# Patient Record
Sex: Male | Born: 2000 | Race: White | Hispanic: Yes | Marital: Single | Smoking: Never smoker
Health system: Southern US, Community
[De-identification: ages and names within clinical notes are randomized; demographics above are authoritative.]

## PROBLEM LIST (undated history)

## (undated) DIAGNOSIS — K219 Gastro-esophageal reflux disease without esophagitis: Secondary | ICD-10-CM

## (undated) DIAGNOSIS — J309 Allergic rhinitis, unspecified: Secondary | ICD-10-CM

## (undated) DIAGNOSIS — Z8709 Personal history of other diseases of the respiratory system: Secondary | ICD-10-CM

## (undated) DIAGNOSIS — Z91013 Allergy to seafood: Secondary | ICD-10-CM

## (undated) HISTORY — PX: NASAL SEPTUM SURGERY: SHX37

## (undated) HISTORY — DX: Gastro-esophageal reflux disease without esophagitis: K21.9

## (undated) HISTORY — DX: Personal history of other diseases of the respiratory system: Z87.09

## (undated) HISTORY — DX: Allergic rhinitis, unspecified: J30.9

## (undated) HISTORY — DX: Allergy to seafood: Z91.013

---
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
## 2020-03-03 DIAGNOSIS — J320 Chronic maxillary sinusitis: Secondary | ICD-10-CM | POA: Diagnosis not present

## 2020-03-03 DIAGNOSIS — J322 Chronic ethmoidal sinusitis: Secondary | ICD-10-CM | POA: Diagnosis not present

## 2020-03-03 DIAGNOSIS — J338 Other polyp of sinus: Secondary | ICD-10-CM | POA: Diagnosis not present

## 2020-03-03 DIAGNOSIS — J310 Chronic rhinitis: Secondary | ICD-10-CM | POA: Diagnosis not present

## 2020-04-14 IMAGING — CT CT MAXILLOFACIAL W/O CM
3 of 5 series · 13 of 47 positions shown, 15 images · non-contrast
Comparison: None.

CLINICAL DATA: Unable to breathe out of LEFT nostril.

EXAM:
CT MAXILLOFACIAL WITHOUT CONTRAST
TECHNIQUE: Multidetector CT images of the paranasal sinuses were obtained using
the standard protocol without intravenous contrast. Study was
performed according to the stealth protocol.

[Series 2: standard · axial · 0.39mm/px · z∈[+1516,+1614]mm · 8 of 114 slices shown, 10 images]
[im 8/114  brain]
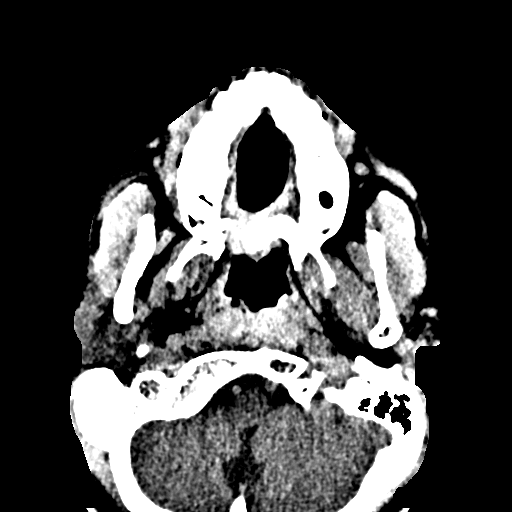
[im 8/114  bone]
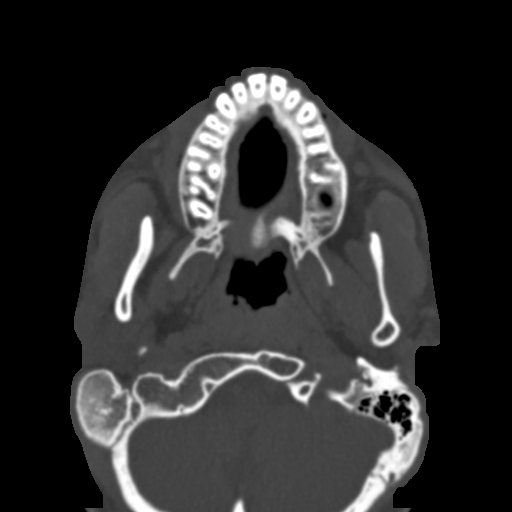
[im 24/114  bone]
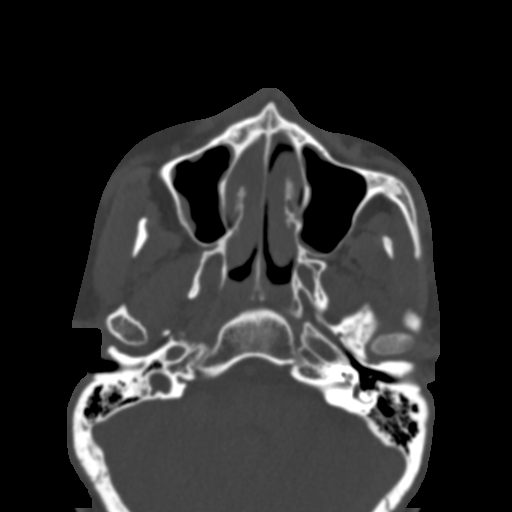
[im 36/114  bone]
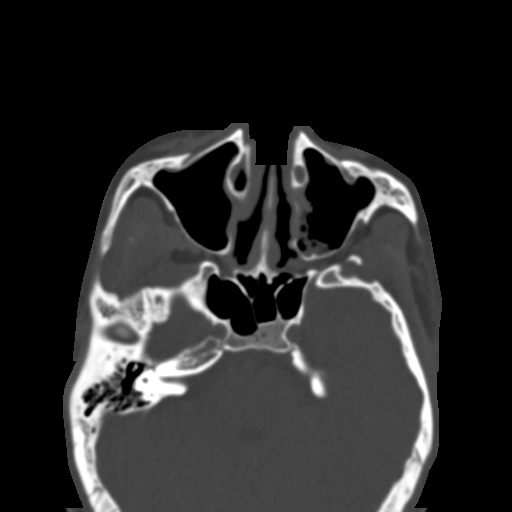
[im 51/114  bone]
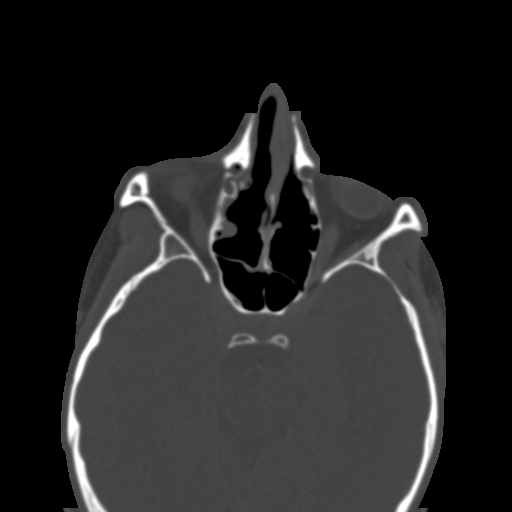
[im 63/114  brain]
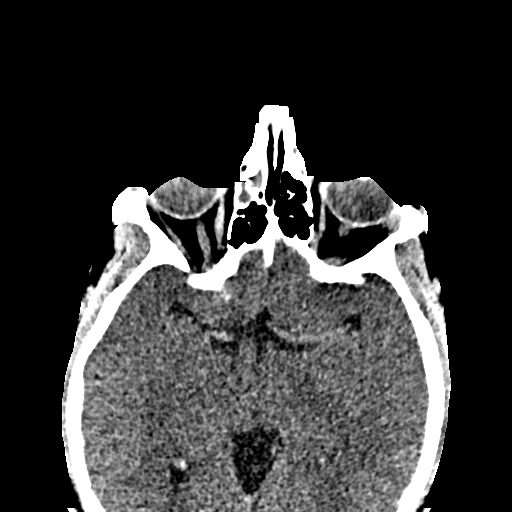
[im 63/114  bone]
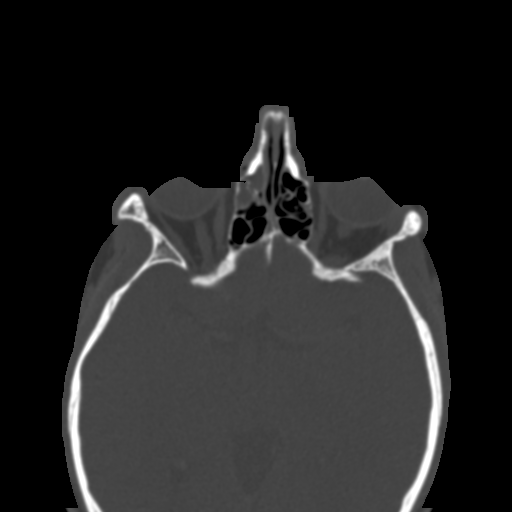
[im 78/114  bone]
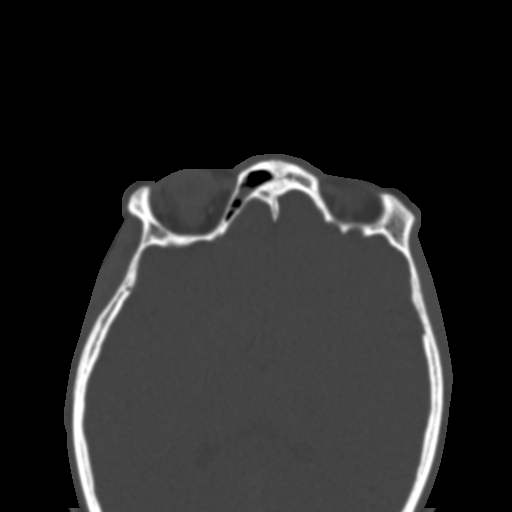
[im 90/114  bone]
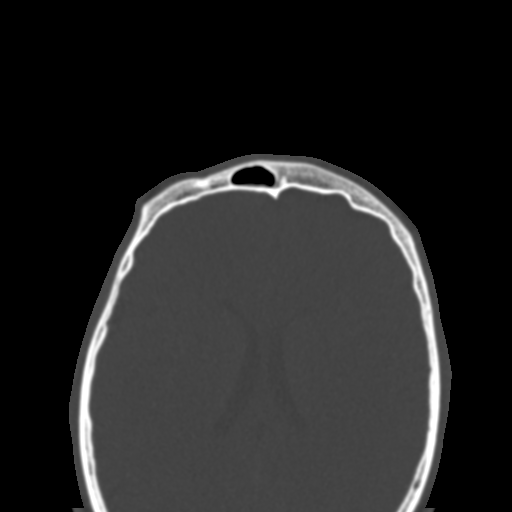
[im 106/114  bone]
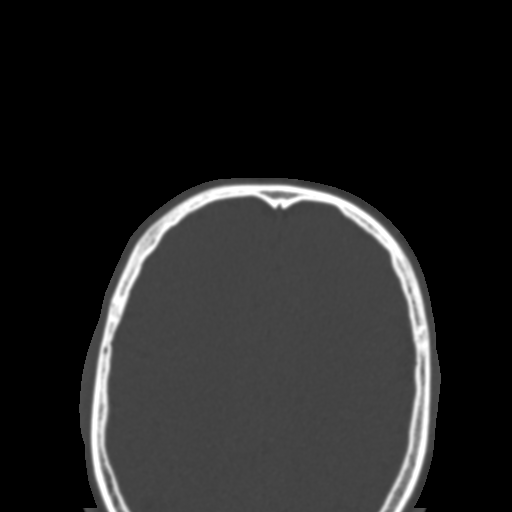

[Series 5: sagittal · sagittal · 0.22mm/px · 2 of 101 slices shown]
[im 34/101  bone]
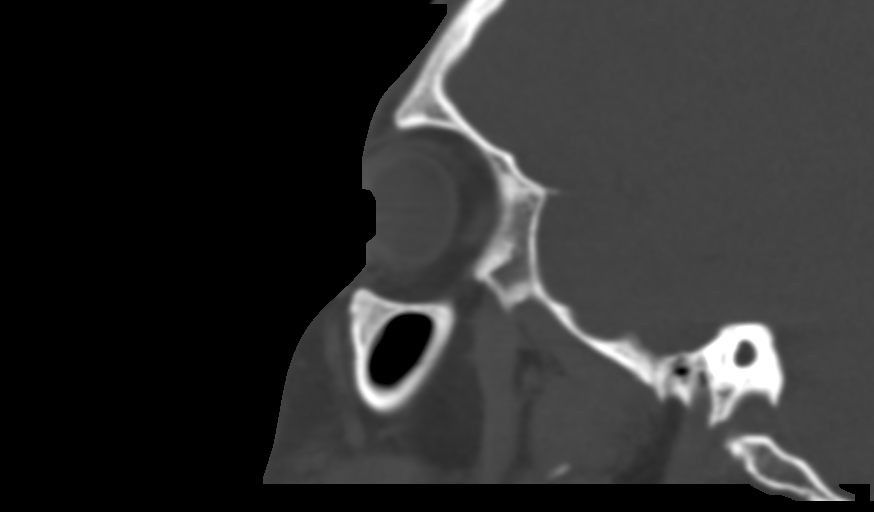
[im 67/101  bone]
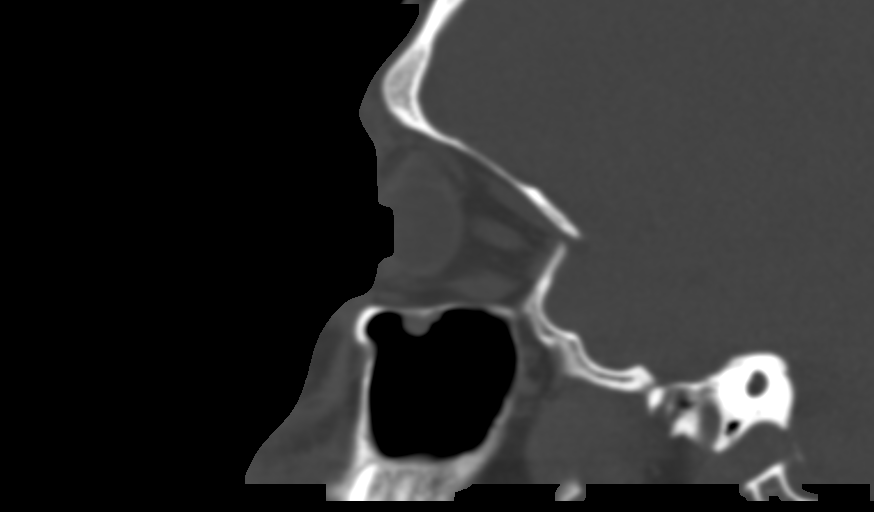

[Series 8: coronal bone · coronal · 0.22mm/px · 3 of 93 slices shown]
[im 24/93  bone]
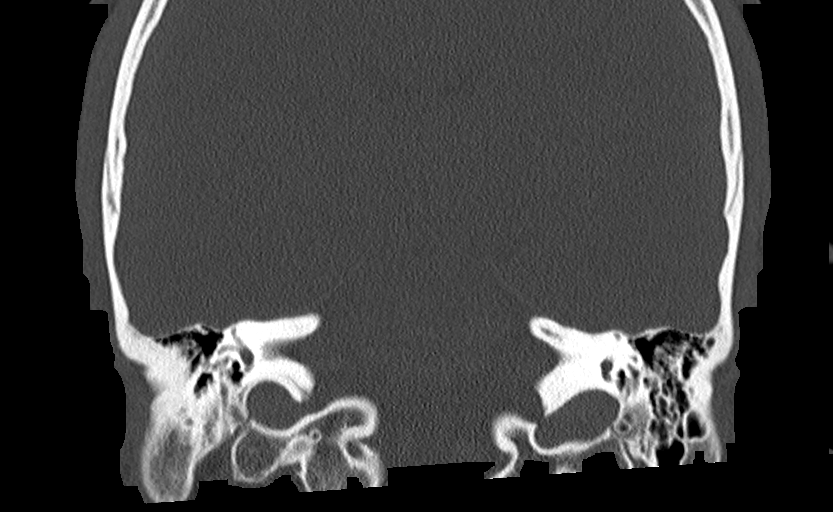
[im 47/93  bone]
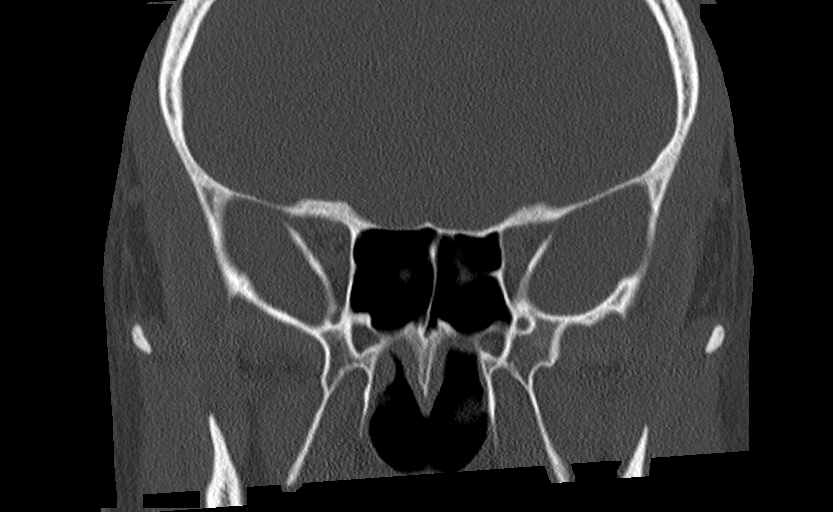
[im 70/93  bone]
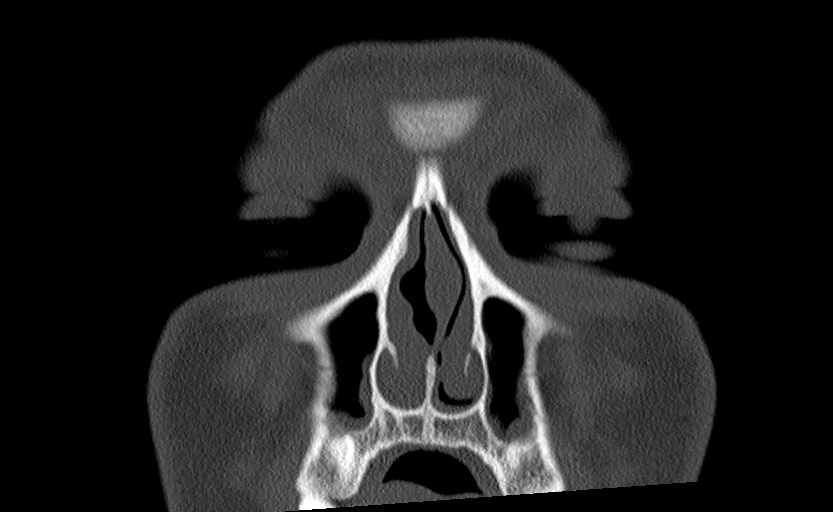

[13 of 47 positions shown; findings below may reference images not displayed]

FINDINGS: Paranasal sinuses:

Frontal: LEFT frontal sinuses hypoplastic. RIGHT frontal sinus
demonstrates mucosal thickening and obstruction of the frontal sinus
drainage pathway.

Ethmoid: BILATERAL ethmoidectomies. Residual mucosal thickening is
greater in the RIGHT greater than LEFT ethmoid regions.

Maxillary: BILATERAL maxillary antrectomies. Residual mucosal
thickening in both maxillary sinuses. Slight layering fluid on the
LEFT.

Sphenoid: Normally aerated. Patent sphenoethmoidal recesses.

Right ostiomeatal unit: Surgically absent. No residual barriers to
maxillary and ethmoid drainage.

Left ostiomeatal unit: Surgically absent. No residual barriers to
maxillary and ethmoid drainage.

Nasal passages: Patent. Intact nasal septum deviates mildly
RIGHT-to-LEFT.

Anatomy: Slight pneumatization superior to anterior ethmoid notches.
Symmetric and intact olfactory grooves and fovea ethmoidalis, Keros
I (1-3mm). Sellar sphenoid pneumatization pattern.

Other: Orbits and intracranial compartment are unremarkable. Visible
mastoid air cells are normally aerated.
IMPRESSION: 1. Status post BILATERAL maxillary antrectomies and BILATERAL
ethmoidectomies. No residual barriers to maxillary and ethmoid
drainage.
2. Residual RIGHT anterior ethmoid mucosal thickening and slight
layering fluid LEFT maxillary sinus.
3. RIGHT frontal sinus mucosal thickening and suspected obstruction
due to mucosal thickening involving the frontal sinus drainage
pathway.

## 2020-04-15 DIAGNOSIS — Z23 Encounter for immunization: Secondary | ICD-10-CM | POA: Diagnosis not present

## 2020-04-29 DIAGNOSIS — Z23 Encounter for immunization: Secondary | ICD-10-CM | POA: Diagnosis not present

## 2020-05-15 ENCOUNTER — Other Ambulatory Visit: Payer: Self-pay

## 2020-05-15 ENCOUNTER — Ambulatory Visit (INDEPENDENT_AMBULATORY_CARE_PROVIDER_SITE_OTHER): Payer: No Typology Code available for payment source | Admitting: Pediatrics

## 2020-05-15 VITALS — Temp 98.6°F | Wt 188.5 lb

## 2020-05-15 DIAGNOSIS — K219 Gastro-esophageal reflux disease without esophagitis: Secondary | ICD-10-CM

## 2020-05-15 MED ORDER — OMEPRAZOLE 40 MG PO CPDR: 40.0000 mg | DELAYED_RELEASE_CAPSULE | Freq: Every day | ORAL | 3 refills | Status: DC

## 2020-05-15 NOTE — Progress Notes (Signed)
Azavier is an 19 year old male here for stomach discomfort for about 1 year.  He was seen in the [facility] ED about a year ago and was told to take OTC antiacids.  He has been taking Prilosec 20 mg, inconsistently over the last year.  It was helpful at first now it is less helpful.  He wakes up about 0400-0500 with a gnawing sensation in his stomach that his very uncomfortable rates his discomfort at 7/10.  Patient reports the epigastric area is the site of his discomfort.    On exam -  Head - normal cephalic Eyes - clear, no erythremia, edema or drainage Ears - TM clear bilaterally  Nose - clear rhinorrhea  Throat - mild erythemia Neck - no adenopathy  Lungs - CTA Heart - RRR with out murmur Abdomen - soft with good bowel sounds GU - not examined MS - Active ROM Neuro - no deficits   This is a 19 year old male with GERD.  Start Prilosec 40 mg daily Referral to GI. Please call or return to this clinic if symptoms worsen or fail to improve after referral.

## 2020-05-20 ENCOUNTER — Encounter: Payer: Self-pay | Admitting: Internal Medicine

## 2020-07-02 ENCOUNTER — Ambulatory Visit: Payer: No Typology Code available for payment source | Admitting: Gastroenterology

## 2020-08-28 ENCOUNTER — Other Ambulatory Visit: Payer: Self-pay

## 2020-08-28 ENCOUNTER — Ambulatory Visit (INDEPENDENT_AMBULATORY_CARE_PROVIDER_SITE_OTHER): Payer: Medicaid Other | Admitting: Nurse Practitioner

## 2020-08-28 ENCOUNTER — Encounter: Payer: Self-pay | Admitting: Nurse Practitioner

## 2020-08-28 DIAGNOSIS — R109 Unspecified abdominal pain: Secondary | ICD-10-CM | POA: Insufficient documentation

## 2020-08-28 DIAGNOSIS — R1013 Epigastric pain: Secondary | ICD-10-CM

## 2020-08-28 DIAGNOSIS — K219 Gastro-esophageal reflux disease without esophagitis: Secondary | ICD-10-CM | POA: Insufficient documentation

## 2020-08-28 NOTE — Patient Instructions (Addendum)
Your health issues we discussed today were:   GERD (reflux/heartburn) with abdominal pain: 1. I am glad you are feeling better! 2. Continue taking omeprazole (Prilosec) 40 mg once a day 3. Let us know if you have any worsening or recurrent symptoms 4. I am printing some information about GERD and things you can do to help prevent worsening GERD symptoms 5. Eventually we can talk about trying to come off the medication and see if you do well without it, but we will keep you on it for now  Overall I recommend:  1. It was great talking with you today! 2. Return for follow-up in 3 months 3. Continue your other current medications 4. Call us if you have any questions or concerns   ---------------------------------------------------------------  I am glad you have gotten your COVID-19 vaccination!  Even though you are fully vaccinated you should continue to follow CDC and state/local guidelines.  ---------------------------------------------------------------   At [facility] we value your feedback. You may receive a survey about your visit today. Please share your experience as we strive to create trusting relationships with our patients to provide genuine, compassionate, quality care.  We appreciate your understanding and patience as we review any laboratory studies, imaging, and other diagnostic tests that are ordered as we care for you. Our office policy is 5 business days for review of these results, and any emergent or urgent results are addressed in a timely manner for your best interest. If you do not hear from our office in 1 week, please contact us.   We also encourage the use of MyChart, which contains your medical information for your review as well. If you are not enrolled in this feature, an access code is on this after visit summary for your convenience. Thank you for allowing us to be involved in your care.  It was great to see you today!  I hope you have a  great rest of your summer!!      Gastroesophageal Reflux Disease, Adult Gastroesophageal reflux (GER) happens when acid from the stomach flows up into the tube that connects the mouth and the stomach (esophagus). Normally, food travels down the esophagus and stays in the stomach to be digested. However, when a person has GER, food and stomach acid sometimes move back up into the esophagus. If this becomes a more serious problem, the person may be diagnosed with a disease called gastroesophageal reflux disease (GERD). GERD occurs when the reflux:  Happens often.  Causes frequent or severe symptoms.  Causes problems such as damage to the esophagus. When stomach acid comes in contact with the esophagus, the acid may cause soreness (inflammation) in the esophagus. Over time, GERD may create small holes (ulcers) in the lining of the esophagus. What are the causes? This condition is caused by a problem with the muscle between the esophagus and the stomach (lower esophageal sphincter, or LES). Normally, the LES muscle closes after food passes through the esophagus to the stomach. When the LES is weakened or abnormal, it does not close properly, and that allows food and stomach acid to go back up into the esophagus. The LES can be weakened by certain dietary substances, medicines, and medical conditions, including:  Tobacco use.  Pregnancy.  Having a hiatal hernia.  Alcohol use.  Certain foods and beverages, such as coffee, chocolate, onions, and peppermint. What increases the risk? You are more likely to develop this condition if you:  Have an increased body weight.  Have a connective  tissue disorder.  Use NSAID medicines. What are the signs or symptoms? Symptoms of this condition include:  Heartburn.  Difficult or painful swallowing.  The feeling of having a lump in the throat.  Abitter taste in the mouth.  Bad breath.  Having a large amount of saliva.  Having an upset  or bloated stomach.  Belching.  Chest pain. Different conditions can cause chest pain. Make sure you see your health care provider if you experience chest pain.  Shortness of breath or wheezing.  Ongoing (chronic) cough or a night-time cough.  Wearing away of tooth enamel.  Weight loss. How is this diagnosed? Your health care provider will take a medical history and perform a physical exam. To determine if you have mild or severe GERD, your health care provider may also monitor how you respond to treatment. You may also have tests, including:  A test to examine your stomach and esophagus with a small camera (endoscopy).  A test thatmeasures the acidity level in your esophagus.  A test thatmeasures how much pressure is on your esophagus.  A barium swallow or modified barium swallow test to show the shape, size, and functioning of your esophagus. How is this treated? The goal of treatment is to help relieve your symptoms and to prevent complications. Treatment for this condition may vary depending on how severe your symptoms are. Your health care provider may recommend:  Changes to your diet.  Medicine.  Surgery. Follow these instructions at home: Eating and drinking   Follow a diet as recommended by your health care provider. This may involve avoiding foods and drinks such as: ? Coffee and tea (with or without caffeine). ? Drinks that containalcohol. ? Energy drinks and sports drinks. ? Carbonated drinks or sodas. ? Chocolate and cocoa. ? Peppermint and mint flavorings. ? Garlic and onions. ? Horseradish. ? Spicy and acidic foods, including peppers, chili powder, curry powder, vinegar, hot sauces, and barbecue sauce. ? Citrus fruit juices and citrus fruits, such as oranges, lemons, and limes. ? Tomato-based foods, such as red sauce, chili, salsa, and pizza with red sauce. ? Fried and fatty foods, such as donuts, french fries, potato chips, and high-fat  dressings. ? High-fat meats, such as hot dogs and fatty cuts of red and white meats, such as rib eye steak, sausage, ham, and bacon. ? High-fat dairy items, such as whole milk, butter, and cream cheese.  Eat small, frequent meals instead of large meals.  Avoid drinking large amounts of liquid with your meals.  Avoid eating meals during the 2-3 hours before bedtime.  Avoid lying down right after you eat.  Do not exercise right after you eat. Lifestyle   Do not use any products that contain nicotine or tobacco, such as cigarettes, e-cigarettes, and chewing tobacco. If you need help quitting, ask your health care provider.  Try to reduce your stress by using methods such as yoga or meditation. If you need help reducing stress, ask your health care provider.  If you are overweight, reduce your weight to an amount that is healthy for you. Ask your health care provider for guidance about a safe weight loss goal. General instructions  Pay attention to any changes in your symptoms.  Take over-the-counter and prescription medicines only as told by your health care provider. Do not take aspirin, ibuprofen, or other NSAIDs unless your health care provider told you to do so.  Wear loose-fitting clothing. Do not wear anything tight around your waist that causes pressure  on your abdomen.  Raise (elevate) the head of your bed about 6 inches (15 cm).  Avoid bending over if this makes your symptoms worse.  Keep all follow-up visits as told by your health care provider. This is important. Contact a health care provider if:  You have: ? New symptoms. ? Unexplained weight loss. ? Difficulty swallowing or it hurts to swallow. ? Wheezing or a persistent cough. ? A hoarse voice.  Your symptoms do not improve with treatment. Get help right away if you:  Have pain in your arms, neck, jaw, teeth, or back.  Feel sweaty, dizzy, or light-headed.  Have chest pain or shortness of breath.  Vomit  and your vomit looks like blood or coffee grounds.  Faint.  Have stool that is bloody or black.  Cannot swallow, drink, or eat. Summary  Gastroesophageal reflux happens when acid from the stomach flows up into the esophagus. GERD is a disease in which the reflux happens often, causes frequent or severe symptoms, or causes problems such as damage to the esophagus.  Treatment for this condition may vary depending on how severe your symptoms are. Your health care provider may recommend diet and lifestyle changes, medicine, or surgery.  Contact a health care provider if you have new or worsening symptoms.  Take over-the-counter and prescription medicines only as told by your health care provider. Do not take aspirin, ibuprofen, or other NSAIDs unless your health care provider told you to do so.  Keep all follow-up visits as told by your health care provider. This is important. This information is not intended to replace advice given to you by your health care provider. Make sure you discuss any questions you have with your health care provider. Document Revised: 06/14/2018 Document Reviewed: 06/14/2018 Elsevier Patient Education  2020 [employer].    Food Choices for Gastroesophageal Reflux Disease, Adult When you have gastroesophageal reflux disease (GERD), the foods you eat and your eating habits are very important. Choosing the right foods can help ease the discomfort of GERD. Consider working with a diet and nutrition specialist (dietitian) to help you make healthy food choices. What general guidelines should I follow?  Eating plan  Choose healthy foods low in fat, such as fruits, vegetables, whole grains, low-fat dairy products, and lean meat, fish, and poultry.  Eat frequent, small meals instead of three large meals each day. Eat your meals slowly, in a relaxed setting. Avoid bending over or lying down until 2-3 hours after eating.  Limit high-fat foods such as fatty meats or  fried foods.  Limit your intake of oils, butter, and shortening to less than 8 teaspoons each day.  Avoid the following: ? Foods that cause symptoms. These may be different for different people. Keep a food diary to keep track of foods that cause symptoms. ? Alcohol. ? Drinking large amounts of liquid with meals. ? Eating meals during the 2-3 hours before bed.  Cook foods using methods other than frying. This may include baking, grilling, or broiling. Lifestyle  Maintain a healthy weight. Ask your health care provider what weight is healthy for you. If you need to lose weight, work with your health care provider to do so safely.  Exercise for at least 30 minutes on 5 or more days each week, or as told by your health care provider.  Avoid wearing clothes that fit tightly around your waist and chest.  Do not use any products that contain nicotine or tobacco, such as cigarettes and e-cigarettes.  If you need help quitting, ask your health care provider.  Sleep with the head of your bed raised. Use a wedge under the mattress or blocks under the bed frame to raise the head of the bed. What foods are not recommended? The items listed may not be a complete list. Talk with your dietitian about what dietary choices are best for you. Grains Pastries or quick breads with added fat. [place] toast. Vegetables Deep fried vegetables. [place] fries. Any vegetables prepared with added fat. Any vegetables that cause symptoms. For some people this may include tomatoes and tomato products, chili peppers, onions and garlic, and horseradish. Fruits Any fruits prepared with added fat. Any fruits that cause symptoms. For some people this may include citrus fruits, such as oranges, grapefruit, pineapple, and lemons. Meats and other protein foods High-fat meats, such as fatty beef or pork, hot dogs, ribs, ham, sausage, salami and bacon. Fried meat or protein, including fried fish and fried chicken. Nuts and nut  butters. Dairy Whole milk and chocolate milk. Sour cream. Cream. Ice cream. Cream cheese. Milk shakes. Beverages Coffee and tea, with or without caffeine. Carbonated beverages. Sodas. Energy drinks. Fruit juice made with acidic fruits (such as orange or grapefruit). Tomato juice. Alcoholic drinks. Fats and oils Butter. Margarine. Shortening. Ghee. Sweets and desserts Chocolate and cocoa. Donuts. Seasoning and other foods Pepper. Peppermint and spearmint. Any condiments, herbs, or seasonings that cause symptoms. For some people, this may include curry, hot sauce, or vinegar-based salad dressings. Summary  When you have gastroesophageal reflux disease (GERD), food and lifestyle choices are very important to help ease the discomfort of GERD.  Eat frequent, small meals instead of three large meals each day. Eat your meals slowly, in a relaxed setting. Avoid bending over or lying down until 2-3 hours after eating.  Limit high-fat foods such as fatty meat or fried foods. This information is not intended to replace advice given to you by your health care provider. Make sure you discuss any questions you have with your health care provider. Document Revised: 03/29/2019 Document Reviewed: 12/07/2016 Elsevier Patient Education  2020 [employer].

## 2020-08-28 NOTE — Progress Notes (Signed)
Primary Care Physician:  Richrd Sox, MD Primary Gastroenterologist:  Dr. Marletta Lor  Chief Complaint  Patient presents with  . Gastroesophageal Reflux    x 1 year    HPI:   David Mcintosh is a 19 y.o. male who presents on referral from primary care for GERD.  Reviewed information provided with referral including office visit dated 05/15/2020 for GERD.  At that time noted abdominal discomfort for the previous 1 year, recently seen in the ED ED about a year ago and was told to take over-the-counter antacids.  Has been taking Prilosec 20 mg inconsistently over the last year that was initially helpful but now less so.  Wakes up early morning with a "gnawing sensation" in his stomach but is very uncomfortable and epigastric pain as well.  Recommended starting Prilosec 40 mg daily, referral to GI.  Today he states he doing okay overall. He's been taking Prilosec 40 mg daily consistently. At one time he started to have his symptoms and took a dose of Prilosec with near immediate relief. The pain is epigastric/RUQ, unknown triggers. When he went to the ER he had taken someone very acidic. Not really having GERD symptoms now that he's on omeprazole 40 mg daily. Denies N/V, hematochezia, melena, fever, chills, unintentional weight loss. Denies URI or flu-like symptoms. Denies loss of sense of taste or smell. The patient has received COVID-19 vaccination(s). Denies chest pain, dyspnea, dizziness, lightheadedness, syncope, near syncope. Denies any other upper or lower GI symptoms.  Past Medical History:  Diagnosis Date  . Allergic rhinitis   . GERD (gastroesophageal reflux disease)   . History of deviated nasal septum    Surgery 2018   . Seafood allergy     Past Surgical History:  Procedure Laterality Date  . ETHMOIDECTOMY Left 07/24/2019   Procedure: LEFT ENDOSCOPIC TOTAL ETHMOIDECTOMY/ BILATERAL TURBINATE REDUCTION;  Surgeon: Newman Pies, MD;  Location: [facility];  Service: ENT;   Laterality: Left;  . FRONTAL SINUS EXPLORATION Right 07/24/2019   Procedure: RIGHT FRONTAL RECESS   EXPLORATION;  Surgeon: Newman Pies, MD;  Location: [facility];  Service: ENT;  Laterality: Right;  . MAXILLARY ANTROSTOMY Bilateral 07/24/2019   Procedure: BILATERAL ENDOSCOPIC MAXILLARY ANTROSTOMY WITH TISSUE REMOVAL;  Surgeon: Newman Pies, MD;  Location: [facility];  Service: ENT;  Laterality: Bilateral;  . NASAL SEPTUM SURGERY    . SINUS ENDO WITH FUSION N/A 07/24/2019   Procedure: SINUS ENDO WITH FUSION;  Surgeon: Newman Pies, MD;  Location: [facility];  Service: ENT;  Laterality: N/A;    Current Outpatient Medications  Medication Sig Dispense Refill  . EPINEPHrine 0.3 mg/0.3 mL IJ SOAJ injection Dispense generic for insurance. One injection to outer thigh for anaphylaxis per package instructions and call 911 1 each 2  . fluticasone (FLONASE) 50 MCG/ACT nasal spray Place 1 spray into both nostrils daily as needed for allergies or rhinitis. 1 g 5  . omeprazole (PRILOSEC) 40 MG capsule Take 1 capsule (40 mg total) by mouth daily. 30 capsule 3   No current facility-administered medications for this visit.    Allergies as of 08/28/2020 - Review Complete 08/28/2020  Allergen Reaction Noted  . Shrimp [shellfish allergy] Swelling 07/24/2019    Family History  Problem Relation Age of Onset  . Healthy Mother   . Colon cancer Neg Hx   . Gastric cancer Neg Hx   . Esophageal cancer Neg Hx     Social History  Socioeconomic History  . Marital status: Single    Spouse name: Not on file  . Number of children: Not on file  . Years of education: Not on file  . Highest education level: Not on file  Occupational History  . Not on file  Tobacco Use  . Smoking status: Never Smoker  . Smokeless tobacco: Never Used  Substance and Sexual Activity  . Alcohol use: Never  . Drug use: Never  . Sexual activity: Not on file  Other Topics Concern  . Not on file    Social History Narrative   Moved from [place] in June 2018       Lives with parents, siblings       Wants to be an Art gallery manager    Social Determinants of Corporate investment banker Strain:   . Difficulty of Paying Living Expenses: Not on file  Food Insecurity:   . Worried About Programme researcher, broadcasting/film/video in the Last Year: Not on file  . Ran Out of Food in the Last Year: Not on file  Transportation Needs:   . Lack of Transportation (Medical): Not on file  . Lack of Transportation (Non-Medical): Not on file  Physical Activity:   . Days of Exercise per Week: Not on file  . Minutes of Exercise per Session: Not on file  Stress:   . Feeling of Stress : Not on file  Social Connections:   . Frequency of Communication with Friends and Family: Not on file  . Frequency of Social Gatherings with Friends and Family: Not on file  . Attends Religious Services: Not on file  . Active Member of Clubs or Organizations: Not on file  . Attends Banker Meetings: Not on file  . Marital Status: Not on file  Intimate Partner Violence:   . Fear of Current or Ex-Partner: Not on file  . Emotionally Abused: Not on file  . Physically Abused: Not on file  . Sexually Abused: Not on file    Subjective: Review of Systems  Constitutional: Negative for chills, fever, malaise/fatigue and weight loss.  HENT: Negative for congestion and sore throat.   Respiratory: Negative for cough and shortness of breath.   Cardiovascular: Negative for chest pain and palpitations.  Gastrointestinal: Negative for abdominal pain, blood in stool, diarrhea, melena, nausea and vomiting.  Musculoskeletal: Negative for joint pain and myalgias.  Skin: Negative for rash.  Neurological: Negative for dizziness and weakness.  Endo/Heme/Allergies: Does not bruise/bleed easily.  Psychiatric/Behavioral: Negative for depression. The patient is not nervous/anxious.   All other systems reviewed and are negative.      Objective: BP  139/74   Pulse 66   Temp 97.9 F (36.6 C) (Temporal)   Ht 5\' 11"  (1.803 m)   Wt 204 lb (92.5 kg)   BMI 28.45 kg/m  Physical Exam Vitals and nursing note reviewed.  Constitutional:      General: He is not in acute distress.    Appearance: Normal appearance. He is normal weight. He is not ill-appearing, toxic-appearing or diaphoretic.  HENT:     Head: Normocephalic and atraumatic.     Nose: No congestion or rhinorrhea.  Eyes:     General: No scleral icterus. Cardiovascular:     Rate and Rhythm: Normal rate and regular rhythm.     Heart sounds: Normal heart sounds.  Pulmonary:     Effort: Pulmonary effort is normal.     Breath sounds: Normal breath sounds.  Abdominal:  General: Bowel sounds are normal. There is no distension.     Palpations: Abdomen is soft. There is no hepatomegaly, splenomegaly or mass.     Tenderness: There is no abdominal tenderness. There is no guarding or rebound.     Hernia: No hernia is present.  Musculoskeletal:     Cervical back: Neck supple.  Skin:    General: Skin is warm and dry.     Coloration: Skin is not jaundiced.     Findings: No bruising or rash.  Neurological:     General: No focal deficit present.     Mental Status: He is alert and oriented to person, place, and time. Mental status is at baseline.  Psychiatric:        Mood and Affect: Mood normal.        Behavior: Behavior normal.        Thought Content: Thought content normal.      Assessment:  Very pleasant 19 year old male who presented to his PCP with complaints of GERD-like symptoms and epigastric pain for the previous year.  He was seen about a year ago in the [facility] emergency room they recommend he take over-the-counter antacids.  He also attempted Prilosec 20 mg intermittently.  His primary care placed him on omeprazole 40 mg daily.  He states he has not missed a dose since and his symptoms have essentially resolved.  No breakthrough symptoms on his  omeprazole 40 mg.  We discussed lifestyle changes and trigger avoidance.  I have printed further information for him as well.  I do not feel there is need for endoscopic evaluation at this point, although this certainly could change depending on his clinical progress.  Hopefully within the next 3 to 6 months we can try coming off his PPI to see if he has continued resolution   Plan: 1. Continue omeprazole 40 mg daily for now 2. Continue other medications 3. Follow-up in 3 months 4. Notify us of worsening symptoms 5. Printed GERD information provided    Thank you for allowing [place] to participate in the care of [facility]  Wynne Dust, [place], AGNP-C Adult & Gerontological Nurse Practitioner [facility]   08/28/2020 4:24 PM   Disclaimer: This note was dictated with voice recognition software. Similar sounding words can inadvertently be transcribed and may not be corrected upon review.

## 2020-09-17 ENCOUNTER — Other Ambulatory Visit: Payer: Self-pay | Admitting: Pediatrics

## 2020-09-29 ENCOUNTER — Ambulatory Visit: Payer: Medicaid Other | Admitting: Pediatrics

## 2020-10-02 DIAGNOSIS — J310 Chronic rhinitis: Secondary | ICD-10-CM | POA: Diagnosis not present

## 2020-10-02 DIAGNOSIS — J343 Hypertrophy of nasal turbinates: Secondary | ICD-10-CM | POA: Diagnosis not present

## 2020-10-31 ENCOUNTER — Other Ambulatory Visit: Payer: Self-pay

## 2020-10-31 ENCOUNTER — Encounter: Payer: Self-pay | Admitting: Pediatrics

## 2020-10-31 ENCOUNTER — Ambulatory Visit (INDEPENDENT_AMBULATORY_CARE_PROVIDER_SITE_OTHER): Payer: Medicaid Other | Admitting: Pediatrics

## 2020-10-31 VITALS — BP 122/64 | Ht 70.5 in | Wt 199.0 lb

## 2020-10-31 DIAGNOSIS — Z113 Encounter for screening for infections with a predominantly sexual mode of transmission: Secondary | ICD-10-CM

## 2020-10-31 DIAGNOSIS — Z00121 Encounter for routine child health examination with abnormal findings: Secondary | ICD-10-CM

## 2020-10-31 DIAGNOSIS — L853 Xerosis cutis: Secondary | ICD-10-CM | POA: Diagnosis not present

## 2020-10-31 DIAGNOSIS — E663 Overweight: Secondary | ICD-10-CM | POA: Diagnosis not present

## 2020-10-31 DIAGNOSIS — Z0001 Encounter for general adult medical examination with abnormal findings: Secondary | ICD-10-CM

## 2020-10-31 MED ORDER — HYDROCORTISONE 2.5 % EX CREA: TOPICAL_CREAM | Freq: Two times a day (BID) | CUTANEOUS | 2 refills | Status: AC | PRN

## 2020-10-31 NOTE — Progress Notes (Signed)
  Adolescent Well Care Visit David Mcintosh is a 19 y.o. male who is here for well care.    PCP:  Richrd Sox, MD   History was provided by the patient.  Confidentiality was discussed with the patient and, if applicable, with caregiver as well. Patient's personal or confidential phone number: [phone]   Current Issues: Current concerns include  None .   Nutrition: Nutrition/Eating Behaviors: 3 meals with 1-2 servings  Adequate calcium in diet?: milk  Supplements/ Vitamins: no   Exercise/ Media: Play any Sports?/ Exercise: on occasion  Screen Time:  > 2 hours-counseling provided Media Rules or Monitoring?: no  Sleep:  Sleep: 8 hours   Social Screening: Lives with:  Family  Parental relations:  good Activities, Work, and Regulatory affairs officer?: helping around the house  Concerns regarding behavior with peers?  no Stressors of note: he is working   Confidential Social History: Tobacco?  no Secondhand smoke exposure?  no Drugs/ETOH?  no  Sexually Active?  no    Safe at home, in school & in relationships?  Yes Safe to self?  Yes   Screenings: Patient has a dental home: yes   PHQ-9 completed and results indicated 0  Physical Exam:  Vitals:   10/31/20 0926  BP: 122/64  Weight: 199 lb (90.3 kg)  Height: 5' 10.5" (1.791 m)   BP 122/64   Ht 5' 10.5" (1.791 m)   Wt 199 lb (90.3 kg)   BMI 28.15 kg/m  Body mass index: body mass index is 28.15 kg/m. Blood pressure percentiles are not available for patients who are 18 years or older.   Hearing Screening   125Hz  250Hz  500Hz  1000Hz  2000Hz  3000Hz  4000Hz  6000Hz  8000Hz   Right ear:   20 20 20 20 20     Left ear:   20 20 20 20 20       Visual Acuity Screening   Right eye Left eye Both eyes  Without correction:     With correction: 20/20 20/20 20/20     General Appearance:   alert, oriented, no acute distress and obese  HENT: Normocephalic, no obvious abnormality, conjunctiva clear  Mouth:   Normal appearing teeth, no obvious  discoloration, dental caries, or dental caps  Neck:   Supple; thyroid: no enlargement, symmetric, no tenderness/mass/nodules  Chest No masses   Lungs:   Clear to auscultation bilaterally, normal work of breathing  Heart:   Regular rate and rhythm, S1 and S2 normal, no murmurs;   Abdomen:   Soft, non-tender, no mass, or organomegaly  GU genitalia not examined  Musculoskeletal:   Tone and strength strong and symmetrical, all extremities               Lymphatic:   No cervical adenopathy  Skin/Hair/Nails:   Skin warm, dry and intact, no rashes, no bruises or petechiae  Neurologic:   Strength, gait, and coordination normal and age-appropriate     Assessment and Plan:   19 yo with male  1. Obesity: we discussed eating and lifestyle changes  2. Dry skin dermatitis: moisturize at least twice daily   BMI is not appropriate for age  Hearing screening result:normal Vision screening result: normal  Counseling provided for all of the components  Orders Placed This Encounter  Procedures  . C. trachomatis/N. gonorrhoeae RNA     Return in 1 year (on 10/31/2021).. We discussed his transitioning to an adult practice   , MD

## 2020-10-31 NOTE — Patient Instructions (Signed)

## 2020-11-03 LAB — C. TRACHOMATIS/N. GONORRHOEAE RNA
C. trachomatis RNA, TMA: NOT DETECTED
N. gonorrhoeae RNA, TMA: NOT DETECTED

## 2020-11-27 ENCOUNTER — Encounter: Payer: Self-pay | Admitting: Nurse Practitioner

## 2020-11-27 ENCOUNTER — Ambulatory Visit (INDEPENDENT_AMBULATORY_CARE_PROVIDER_SITE_OTHER): Payer: Medicaid Other | Admitting: Nurse Practitioner

## 2020-11-27 ENCOUNTER — Other Ambulatory Visit: Payer: Self-pay

## 2020-11-27 VITALS — BP 146/75 | HR 68 | Temp 97.3°F | Ht 71.0 in | Wt 197.0 lb

## 2020-11-27 DIAGNOSIS — K219 Gastro-esophageal reflux disease without esophagitis: Secondary | ICD-10-CM | POA: Diagnosis not present

## 2020-11-27 DIAGNOSIS — R1013 Epigastric pain: Secondary | ICD-10-CM | POA: Diagnosis not present

## 2020-11-27 NOTE — Patient Instructions (Signed)
Your health issues we discussed today were:   GERD (reflux/heartburn) with upper abdominal pain: 1. I am glad you are doing better! 2. For now, continue taking omeprazole 40 mg once a day 3. As we discussed, work on diet and exercise for weight loss with a goal weight of 180 pounds. 4. Avoid triggers that tend to cause a flare of your symptoms (such as pizza) 5. If you do have a flare of your symptoms you can use Tums to get through a flare 6. Depending on how your symptoms do in the future we can consider reducing your dose and/or eventually stopping your medication 7. Call us if you have any worsening symptoms  Overall I recommend:  1. Continue your other current medications 2. Return for follow-up in 6 months 3. Call us for any questions or concerns   ---------------------------------------------------------------  I am glad you have gotten your COVID-19 vaccination!  Even though you are fully vaccinated you should continue to follow CDC and state/local guidelines.  ---------------------------------------------------------------   At [facility] we value your feedback. You may receive a survey about your visit today. Please share your experience as we strive to create trusting relationships with our patients to provide genuine, compassionate, quality care.  We appreciate your understanding and patience as we review any laboratory studies, imaging, and other diagnostic tests that are ordered as we care for you. Our office policy is 5 business days for review of these results, and any emergent or urgent results are addressed in a timely manner for your best interest. If you do not hear from our office in 1 week, please contact us.   We also encourage the use of MyChart, which contains your medical information for your review as well. If you are not enrolled in this feature, an access code is on this after visit summary for your convenience. Thank you for allowing [place] to be  involved in your care.  It was great to see you today!  I hope you have a Merry Christmas and [address]!!

## 2020-11-27 NOTE — Progress Notes (Signed)
Referring Provider: Richrd Sox, MD Primary Care Physician:  Richrd Sox, MD Primary GI:  Dr. Marletta Lor  Chief Complaint  Patient presents with  . Gastroesophageal Reflux    Had went 2 weeks without taking omep and then ate elizabeth pizza and had bad reflux episode and restarted medication.  . Abdominal Pain    Only when he has reflux     HPI:   David Mcintosh is a 19 y.o. male who presents for follow-up on GERD and abdominal pain.  The patient was last seen in our office 08/28/2020 for GERD and epigastric pain.  At his last visit has been taking Prilosec 40 mg daily which was helping his symptoms.  Pain is epigastric/right upper quadrant with unknown triggers.  At that time he is not having GERD symptoms on the PPI.  Recommended he continue his PPI, follow-up in 3 months, notify us of any worsening.  Printed GERD patient information was given.  Today he states he doing okay overall.  He was doing quite well and stopped taking his omeprazole for 2 weeks, however he then ate pizza and had a bad episode so he restarted his medication.  He only has abdominal pain when he has reflux, which he generally does not have when he is taking omeprazole. Since he has restarted his omeprazole he has not had any issues. He does not his stomach feels "hot" when he eats things sometimes (not really burning). Denies abdominal pain, N/V, hematochezia, melena, fever, chills, unintentional weight loss. Denies URI or flu-like symptoms. Denies loss of sense of taste or smell. The patient has received COVID-19 vaccination(s). Denies chest pain, dyspnea, dizziness, lightheadedness, syncope, near syncope. Denies any other upper or lower GI symptoms.  Past Medical History:  Diagnosis Date  . Allergic rhinitis   . GERD (gastroesophageal reflux disease)   . History of deviated nasal septum    Surgery 2018   . Seafood allergy     Past Surgical History:  Procedure Laterality Date  . ETHMOIDECTOMY Left 07/24/2019    Procedure: LEFT ENDOSCOPIC TOTAL ETHMOIDECTOMY/ BILATERAL TURBINATE REDUCTION;  Surgeon: Newman Pies, MD;  Location: [facility];  Service: ENT;  Laterality: Left;  . FRONTAL SINUS EXPLORATION Right 07/24/2019   Procedure: RIGHT FRONTAL RECESS   EXPLORATION;  Surgeon: Newman Pies, MD;  Location: [facility];  Service: ENT;  Laterality: Right;  . MAXILLARY ANTROSTOMY Bilateral 07/24/2019   Procedure: BILATERAL ENDOSCOPIC MAXILLARY ANTROSTOMY WITH TISSUE REMOVAL;  Surgeon: Newman Pies, MD;  Location: [facility];  Service: ENT;  Laterality: Bilateral;  . NASAL SEPTUM SURGERY    . SINUS ENDO WITH FUSION N/A 07/24/2019   Procedure: SINUS ENDO WITH FUSION;  Surgeon: Newman Pies, MD;  Location: [facility];  Service: ENT;  Laterality: N/A;    Current Outpatient Medications  Medication Sig Dispense Refill  . EPINEPHrine 0.3 mg/0.3 mL IJ SOAJ injection Dispense generic for insurance. One injection to outer thigh for anaphylaxis per package instructions and call 911 1 each 2  . fluticasone (FLONASE) 50 MCG/ACT nasal spray Place 1 spray into both nostrils daily as needed for allergies or rhinitis. 1 g 5  . omeprazole (PRILOSEC) 40 MG capsule TAKE 1 CAPSULE BY MOUTH EVERY DAY 30 capsule 3   No current facility-administered medications for this visit.    Allergies as of 11/27/2020 - Review Complete 11/27/2020  Allergen Reaction Noted  . Shrimp [shellfish allergy] Swelling 07/24/2019    Family History  Problem Relation Age of Onset  . Healthy Mother   . Colon cancer Neg Hx   . Gastric cancer Neg Hx   . Esophageal cancer Neg Hx     Social History   Socioeconomic History  . Marital status: Single    Spouse name: Not on file  . Number of children: Not on file  . Years of education: Not on file  . Highest education level: Not on file  Occupational History  . Not on file  Tobacco Use  . Smoking status: Never Smoker  . Smokeless tobacco: Never Used   Substance and Sexual Activity  . Alcohol use: Never  . Drug use: Never  . Sexual activity: Not on file  Other Topics Concern  . Not on file  Social History Narrative   Moved from [place] in June 2018       Lives with parents, siblings       Wants to be an Art gallery manager    Social Determinants of Corporate investment banker Strain: Not on file  Food Insecurity: Not on file  Transportation Needs: Not on file  Physical Activity: Not on file  Stress: Not on file  Social Connections: Not on file    Subjective: Review of Systems  Constitutional: Negative for chills, fever, malaise/fatigue and weight loss.  HENT: Negative for congestion and sore throat.   Respiratory: Negative for cough and shortness of breath.   Cardiovascular: Negative for chest pain and palpitations.  Gastrointestinal: Negative for abdominal pain, blood in stool, diarrhea, heartburn, melena, nausea and vomiting.  Musculoskeletal: Negative for joint pain and myalgias.  Skin: Negative for rash.  Neurological: Negative for dizziness and weakness.  Endo/Heme/Allergies: Does not bruise/bleed easily.  Psychiatric/Behavioral: Negative for depression. The patient is not nervous/anxious.   All other systems reviewed and are negative.    Objective: BP (!) 146/75   Pulse 68   Temp (!) 97.3 F (36.3 C)   Ht 5\' 11"  (1.803 m)   Wt 197 lb (89.4 kg)   BMI 27.48 kg/m  Physical Exam Vitals and nursing note reviewed.  Constitutional:      General: He is not in acute distress.    Appearance: Normal appearance. He is normal weight. He is not ill-appearing, toxic-appearing or diaphoretic.  HENT:     Head: Normocephalic and atraumatic.     Nose: No congestion or rhinorrhea.  Eyes:     General: No scleral icterus. Cardiovascular:     Rate and Rhythm: Normal rate and regular rhythm.     Heart sounds: Normal heart sounds.  Pulmonary:     Effort: Pulmonary effort is normal.     Breath sounds: Normal breath sounds.  Abdominal:      General: Bowel sounds are normal. There is no distension.     Palpations: Abdomen is soft. There is no hepatomegaly, splenomegaly or mass.     Tenderness: There is no abdominal tenderness. There is no guarding or rebound.     Hernia: No hernia is present.  Musculoskeletal:     Cervical back: Neck supple.  Skin:    General: Skin is warm and dry.     Coloration: Skin is not jaundiced.     Findings: No bruising or rash.  Neurological:     General: No focal deficit present.     Mental Status: He is alert and oriented to person, place, and time. Mental status is at baseline.  Psychiatric:        Mood and Affect:  Mood normal.        Behavior: Behavior normal.        Thought Content: Thought content normal.      Assessment:  Very pleasant 19 year old male who presents for follow-up on GERD and epigastric pain.  Previously diagnosed with significant GERD.  Was doing well on omeprazole.  He ran out for about 2 weeks and then had pizza which caused significant flareup.  When he refilled his medication he began taking it and has not had any problems since.  Only has epigastric pain if he is having a flare of GERD.  We again discussed use of PPI.  He would eventually like to come off of it.  We discussed diet and exercise for healthy weight which can help improve his symptoms and he is committed to doing this.  He had a question about possible adverse effects with PPI which we thoroughly discussed.  We again reviewed trigger avoidance as well.  No red flag/warning signs or symptoms.   Plan: 1. Continue omeprazole 40 mg daily 2. Diet and exercise for weight loss with goal weight of 180 pounds based on BMI 3. If he is able to improve his symptoms with weight loss and trigger avoidance we can consider reducing medication dose and eventually stopping his PPI 4. Call us for any worsening or severe symptoms 5. Avoid triggers 6. Tums as needed for breakthrough symptoms 7. Follow-up in 6  months    Thank you for allowing [place] to participate in the care of [facility]  Wynne Dust, [place], AGNP-C Adult & Gerontological Nurse Practitioner [facility]   11/27/2020 4:11 PM   Disclaimer: This note was dictated with voice recognition software. Similar sounding words can inadvertently be transcribed and may not be corrected upon review.

## 2020-11-28 NOTE — Progress Notes (Signed)
Cc'ed to pcp °

## 2020-12-05 ENCOUNTER — Ambulatory Visit: Payer: Medicaid Other | Admitting: Allergy & Immunology

## 2021-02-11 ENCOUNTER — Other Ambulatory Visit: Payer: Self-pay | Admitting: Pediatrics

## 2021-02-11 NOTE — Telephone Encounter (Signed)
Need a refill 

## 2021-03-31 ENCOUNTER — Encounter: Payer: Self-pay | Admitting: Internal Medicine

## 2021-04-23 ENCOUNTER — Encounter: Payer: Self-pay | Admitting: Gastroenterology

## 2021-06-04 ENCOUNTER — Ambulatory Visit: Payer: Medicaid Other | Admitting: Nurse Practitioner

## 2021-06-11 ENCOUNTER — Ambulatory Visit: Payer: Medicaid Other | Admitting: Gastroenterology

## 2021-06-18 DIAGNOSIS — J310 Chronic rhinitis: Secondary | ICD-10-CM | POA: Diagnosis not present

## 2021-06-18 DIAGNOSIS — J343 Hypertrophy of nasal turbinates: Secondary | ICD-10-CM | POA: Diagnosis not present

## 2021-06-18 DIAGNOSIS — J324 Chronic pansinusitis: Secondary | ICD-10-CM | POA: Diagnosis not present

## 2021-06-29 ENCOUNTER — Encounter: Payer: Self-pay | Admitting: Pediatrics

## 2021-07-29 ENCOUNTER — Encounter: Payer: Self-pay | Admitting: Internal Medicine

## 2021-09-04 ENCOUNTER — Ambulatory Visit: Payer: Medicaid Other | Admitting: Gastroenterology

## 2021-10-26 DIAGNOSIS — Z23 Encounter for immunization: Secondary | ICD-10-CM | POA: Diagnosis not present

## 2021-10-26 DIAGNOSIS — Z68.41 Body mass index (BMI) pediatric, 85th percentile to less than 95th percentile for age: Secondary | ICD-10-CM | POA: Diagnosis not present

## 2021-10-26 DIAGNOSIS — E781 Pure hyperglyceridemia: Secondary | ICD-10-CM | POA: Diagnosis not present

## 2021-10-26 DIAGNOSIS — Z8709 Personal history of other diseases of the respiratory system: Secondary | ICD-10-CM | POA: Diagnosis not present

## 2021-11-26 ENCOUNTER — Ambulatory Visit: Payer: Medicaid Other | Admitting: Gastroenterology

## 2021-12-09 DIAGNOSIS — J324 Chronic pansinusitis: Secondary | ICD-10-CM | POA: Diagnosis not present

## 2021-12-09 DIAGNOSIS — J310 Chronic rhinitis: Secondary | ICD-10-CM | POA: Diagnosis not present

## 2021-12-09 DIAGNOSIS — J343 Hypertrophy of nasal turbinates: Secondary | ICD-10-CM | POA: Diagnosis not present

## 2022-02-24 DIAGNOSIS — J029 Acute pharyngitis, unspecified: Secondary | ICD-10-CM | POA: Diagnosis not present

## 2022-02-24 DIAGNOSIS — J069 Acute upper respiratory infection, unspecified: Secondary | ICD-10-CM | POA: Diagnosis not present

## 2022-02-24 DIAGNOSIS — Z6828 Body mass index (BMI) 28.0-28.9, adult: Secondary | ICD-10-CM | POA: Diagnosis not present

## 2022-04-01 ENCOUNTER — Ambulatory Visit: Payer: Medicaid Other | Admitting: Gastroenterology

## 2022-04-22 ENCOUNTER — Encounter: Payer: Self-pay | Admitting: *Deleted

## 2022-05-25 NOTE — Progress Notes (Deleted)
Referring Provider: Richrd Sox, MD Primary Care Physician:  Richrd Sox, MD Primary GI Physician: Dr. Marletta Lor  No chief complaint on file.   HPI:   David Mcintosh is a 21 y.o. male with history of GERD and epigastric/RUQ abdominal pain, presenting today for follow-up.   Last seen in out office in December 2021. He had been doing well off PPI but had a flare of reflux after eating [employer] and resumed omeprazole 40 mg daily.  Only having abdominal pain when having reflux.  No recurrent issues since restarting omeprazole.  Recommended continuing current medications and follow-up in 6 months.  Today:    Past Medical History:  Diagnosis Date   Allergic rhinitis    GERD (gastroesophageal reflux disease)    History of deviated nasal septum    Surgery 2018    Seafood allergy     Past Surgical History:  Procedure Laterality Date   ETHMOIDECTOMY Left 07/24/2019   Procedure: LEFT ENDOSCOPIC TOTAL ETHMOIDECTOMY/ BILATERAL TURBINATE REDUCTION;  Surgeon: Newman Pies, MD;  Location: [facility];  Service: ENT;  Laterality: Left;   FRONTAL SINUS EXPLORATION Right 07/24/2019   Procedure: RIGHT FRONTAL RECESS   EXPLORATION;  Surgeon: Newman Pies, MD;  Location: [facility];  Service: ENT;  Laterality: Right;   MAXILLARY ANTROSTOMY Bilateral 07/24/2019   Procedure: BILATERAL ENDOSCOPIC MAXILLARY ANTROSTOMY WITH TISSUE REMOVAL;  Surgeon: Newman Pies, MD;  Location: [facility];  Service: ENT;  Laterality: Bilateral;   NASAL SEPTUM SURGERY     SINUS ENDO WITH FUSION N/A 07/24/2019   Procedure: SINUS ENDO WITH FUSION;  Surgeon: Newman Pies, MD;  Location: [facility];  Service: ENT;  Laterality: N/A;    Current Outpatient Medications  Medication Sig Dispense Refill   EPINEPHrine 0.3 mg/0.3 mL IJ SOAJ injection Dispense generic for insurance. One injection to outer thigh for anaphylaxis per package instructions and call 911 1 each 2    fluticasone (FLONASE) 50 MCG/ACT nasal spray Place 1 spray into both nostrils daily as needed for allergies or rhinitis. 1 g 5   omeprazole (PRILOSEC) 40 MG capsule TAKE 1 CAPSULE BY MOUTH EVERY DAY 90 capsule 1   No current facility-administered medications for this visit.    Allergies as of 05/27/2022 - Review Complete 11/27/2020  Allergen Reaction Noted   Shrimp [shellfish allergy] Swelling 07/24/2019    Family History  Problem Relation Age of Onset   Healthy Mother    Colon cancer Neg Hx    Gastric cancer Neg Hx    Esophageal cancer Neg Hx     Social History   Socioeconomic History   Marital status: Single    Spouse name: Not on file   Number of children: Not on file   Years of education: Not on file   Highest education level: Not on file  Occupational History   Not on file  Tobacco Use   Smoking status: Never   Smokeless tobacco: Never  Substance and Sexual Activity   Alcohol use: Never   Drug use: Never   Sexual activity: Not on file  Other Topics Concern   Not on file  Social History Narrative   Moved from [place] in June 2018       Lives with parents, siblings       Wants to be an Art gallery manager    Social Determinants of Corporate investment banker Strain: Not on file  Food Insecurity: Not on file  Transportation Needs: Not on file  Physical Activity: Not on file  Stress: Not on file  Social Connections: Not on file    Review of Systems: Gen: Denies fever, chills, cold or flu like symptoms, pre-syncope, or syncope.  CV: Denies chest pain, palpitations. Resp: Denies dyspnea, cough.  GI: See HPI Heme: See HPI  Physical Exam: There were no vitals taken for this visit. General:   Alert and oriented. No distress noted. Pleasant and cooperative.  Head:  Normocephalic and atraumatic. Eyes:  Conjuctiva clear without scleral icterus. Heart:  S1, S2 present without murmurs appreciated. Lungs:  Clear to auscultation bilaterally. No wheezes, rales, or rhonchi. No  distress.  Abdomen:  +BS, soft, non-tender and non-distended. No rebound or guarding. No HSM or masses noted. Msk:  Symmetrical without gross deformities. Normal posture. Extremities:  Without edema. Neurologic:  Alert and  oriented x4 Psych:  Normal mood and affect.    Assessment:     Plan:  ***   Ermalinda Memos, PA-C [facility] 05/27/2022

## 2022-05-27 ENCOUNTER — Ambulatory Visit: Payer: Medicaid Other | Admitting: Gastroenterology

## 2022-06-09 DIAGNOSIS — J310 Chronic rhinitis: Secondary | ICD-10-CM | POA: Diagnosis not present

## 2022-06-09 DIAGNOSIS — J343 Hypertrophy of nasal turbinates: Secondary | ICD-10-CM | POA: Diagnosis not present

## 2022-06-09 DIAGNOSIS — J324 Chronic pansinusitis: Secondary | ICD-10-CM | POA: Diagnosis not present

## 2022-06-09 NOTE — Progress Notes (Unsigned)
Referring Provider: *** Primary Care Physician:  Richrd Sox, MD  Primary GI: ***  Patient Location: Home   Provider Location: [facility] office   Reason for Visit: ***   Persons present on the virtual encounter, with roles: Ermalinda Memos, PA-C (Provider), David Mcintosh (patient)   Total time (minutes) spent on medical discussion: ### minutes  Virtual Visit via *** Note Due to COVID-19, visit is conducted virtually and was requested by patient.   I connected withNAME@ on 06/09/22 at  8:00 AM EDT by *** and verified that I am speaking with the correct person using two identifiers.   I discussed the limitations, risks, security and privacy concerns of performing an evaluation and management service by *** and the availability of in person appointments. I also discussed with the patient that there may be a patient responsible charge related to this service. The patient expressed understanding and agreed to proceed.  No chief complaint on file.    History of Present Illness: David Mcintosh is a 21 y.o. male with history of GERD with associated epigastric abdominal/RUQ abdominal pain, presenting today for follow-up.   Last seen in out office in December 2021. He had been doing well off PPI but had a flare of reflux after eating [employer] and resumed omeprazole 40 mg daily.  Only having abdominal pain when having reflux.  No recurrent issues since restarting omeprazole.  Recommended continuing current medications and follow-up in 6 months.  Today:    Past Medical History:  Diagnosis Date   Allergic rhinitis    GERD (gastroesophageal reflux disease)    History of deviated nasal septum    Surgery 2018    Seafood allergy      Past Surgical History:  Procedure Laterality Date   ETHMOIDECTOMY Left 07/24/2019   Procedure: LEFT ENDOSCOPIC TOTAL ETHMOIDECTOMY/ BILATERAL TURBINATE REDUCTION;  Surgeon: Newman Pies, MD;  Location: [facility];  Service: ENT;   Laterality: Left;   FRONTAL SINUS EXPLORATION Right 07/24/2019   Procedure: RIGHT FRONTAL RECESS   EXPLORATION;  Surgeon: Newman Pies, MD;  Location: [facility];  Service: ENT;  Laterality: Right;   MAXILLARY ANTROSTOMY Bilateral 07/24/2019   Procedure: BILATERAL ENDOSCOPIC MAXILLARY ANTROSTOMY WITH TISSUE REMOVAL;  Surgeon: Newman Pies, MD;  Location: [facility];  Service: ENT;  Laterality: Bilateral;   NASAL SEPTUM SURGERY     SINUS ENDO WITH FUSION N/A 07/24/2019   Procedure: SINUS ENDO WITH FUSION;  Surgeon: Newman Pies, MD;  Location: [facility];  Service: ENT;  Laterality: N/A;     No outpatient medications have been marked as taking for the 06/11/22 encounter (Appointment) with Letta Median, PA-C.     Family History  Problem Relation Age of Onset   Healthy Mother    Colon cancer Neg Hx    Gastric cancer Neg Hx    Esophageal cancer Neg Hx     Social History   Socioeconomic History   Marital status: Single    Spouse name: Not on file   Number of children: Not on file   Years of education: Not on file   Highest education level: Not on file  Occupational History   Not on file  Tobacco Use   Smoking status: Never   Smokeless tobacco: Never  Substance and Sexual Activity   Alcohol use: Never   Drug use: Never   Sexual activity: Not on file  Other Topics Concern   Not on file  Social  History Narrative   Moved from [place] in June 2018       Lives with parents, siblings       Wants to be an Art gallery manager    Social Determinants of Corporate investment banker Strain: Not on file  Food Insecurity: Not on file  Transportation Needs: Not on file  Physical Activity: Not on file  Stress: Not on file  Social Connections: Not on file       Review of Systems: Gen: Denies fever, chills, cold or flu like symptoms, pre-syncope, or syncope.  CV: Denies chest pain, palpitations. Resp: Denies dyspnea, cough.  GI: see HPI  Heme: See  HPI  Observations/Objective: No distress. Alert and oriented. Pleasant. Well nourished. Normal mood and affect. Unable to perform complete physical exam due to *** encounter. No video available. ***   Assessment:     Plan: ***     I discussed the assessment and treatment plan with the patient. The patient was provided an opportunity to ask questions and all were answered. The patient agreed with the plan and demonstrated an understanding of the instructions.   The patient was advised to call back or seek an in-person evaluation if the symptoms worsen or if the condition fails to improve as anticipated.  I provided *** minutes of non***-face-to-face time during this encounter.  Ermalinda Memos, PA-C [facility]  06/11/2022

## 2022-06-11 ENCOUNTER — Encounter: Payer: Self-pay | Admitting: Gastroenterology

## 2022-06-11 ENCOUNTER — Telehealth (INDEPENDENT_AMBULATORY_CARE_PROVIDER_SITE_OTHER): Payer: Medicaid Other | Admitting: Gastroenterology

## 2022-06-11 VITALS — Ht 71.0 in | Wt 195.0 lb

## 2022-06-11 DIAGNOSIS — K219 Gastro-esophageal reflux disease without esophagitis: Secondary | ICD-10-CM | POA: Diagnosis not present

## 2024-09-07 ENCOUNTER — Encounter: Payer: Self-pay | Admitting: *Deleted

## 2025-04-10 ENCOUNTER — Ambulatory Visit: Admitting: Physician Assistant
